# Patient Record
Sex: Male | Born: 1995 | Race: White | Hispanic: No | Marital: Single | Smoking: Never smoker
Health system: Southern US, Community
[De-identification: ages and names within clinical notes are randomized; demographics above are authoritative.]

## PROBLEM LIST (undated history)

## (undated) DIAGNOSIS — K439 Ventral hernia without obstruction or gangrene: Secondary | ICD-10-CM

---

## 2017-02-18 ENCOUNTER — Encounter (HOSPITAL_COMMUNITY): Payer: Self-pay | Admitting: Emergency Medicine

## 2017-02-18 ENCOUNTER — Emergency Department (HOSPITAL_COMMUNITY): Payer: Self-pay

## 2017-02-18 DIAGNOSIS — W51XXXA Accidental striking against or bumped into by another person, initial encounter: Secondary | ICD-10-CM | POA: Insufficient documentation

## 2017-02-18 DIAGNOSIS — Y998 Other external cause status: Secondary | ICD-10-CM | POA: Insufficient documentation

## 2017-02-18 DIAGNOSIS — Z79899 Other long term (current) drug therapy: Secondary | ICD-10-CM | POA: Insufficient documentation

## 2017-02-18 DIAGNOSIS — Y9367 Activity, basketball: Secondary | ICD-10-CM | POA: Insufficient documentation

## 2017-02-18 DIAGNOSIS — S83015A Lateral dislocation of left patella, initial encounter: Secondary | ICD-10-CM | POA: Insufficient documentation

## 2017-02-18 DIAGNOSIS — Y929 Unspecified place or not applicable: Secondary | ICD-10-CM | POA: Insufficient documentation

## 2017-02-18 NOTE — ED Triage Notes (Signed)
[facility] and jumped up for the ball and became tangled with other players  Pt is c/o left knee pain  EMS applied a splint to left leg  Pt was unable to bear weight  Pt states his knee feels numb now but it is painful to move

## 2017-02-19 ENCOUNTER — Emergency Department (HOSPITAL_COMMUNITY): Payer: Self-pay

## 2017-02-19 ENCOUNTER — Emergency Department (HOSPITAL_COMMUNITY)
Admission: EM | Admit: 2017-02-19 | Discharge: 2017-02-19 | Disposition: A | Discharge status: Home / Self Care | Payer: Self-pay | Attending: Emergency Medicine | Admitting: Emergency Medicine

## 2017-02-19 ENCOUNTER — Encounter (HOSPITAL_COMMUNITY): Payer: Self-pay | Admitting: Radiology

## 2017-02-19 DIAGNOSIS — S83005A Unspecified dislocation of left patella, initial encounter: Secondary | ICD-10-CM

## 2017-02-19 HISTORY — DX: Ventral hernia without obstruction or gangrene: K43.9

## 2017-02-19 MED ORDER — OXYCODONE-ACETAMINOPHEN 5-325 MG PO TABS: 1.0000 | ORAL_TABLET | Freq: Four times a day (QID) | ORAL | 0 refills | Status: AC | PRN

## 2017-02-19 MED ORDER — OXYCODONE-ACETAMINOPHEN 5-325 MG PO TABS
1.0000 | ORAL_TABLET | Freq: Once | ORAL | Status: AC
  Administered 2017-02-19: 1 via ORAL
  Filled 2017-02-19: qty 1

## 2017-02-19 MED ORDER — OXYCODONE-ACETAMINOPHEN 5-325 MG PO TABS
1.0000 | ORAL_TABLET | ORAL | Status: DC | PRN
  Administered 2017-02-19: 1 via ORAL
  Filled 2017-02-19: qty 1

## 2017-02-19 MED ORDER — MORPHINE SULFATE (PF) 2 MG/ML IV SOLN
4.0000 mg | Freq: Once | INTRAVENOUS | Status: AC
  Administered 2017-02-19: 4 mg via INTRAVENOUS
  Filled 2017-02-19: qty 2

## 2017-02-19 NOTE — ED Provider Notes (Signed)
[facility]-EMERGENCY DEPT Provider Note   CSN: [id number] Arrival date & time: 02/18/17  2316  By signing my name below, I, Ny'Kea Lewis, attest that this documentation has been prepared under the direction and in the presence of Karri Kallenbach, Mayer Maskerourtney F, MD. Electronically Signed: Karren CobbleNy'Kea Lewis, ED Scribe. 02/19/17. 4:46 AM.   History   Chief Complaint Chief Complaint  Patient presents with  . Knee Pain   The history is provided by the patient. No language interpreter was used.    HPI Comments: Christian Bolton is a 21 y.o. male brought in by ambulance, who presents to the Emergency Department complaining of sudden onset, left knee injury s/p basketball injury PTA. Pt notes some numbness to his knee, but is able to move . He reports he was playing basketball when he jumped for the ball and became tangled with other players, per nurse's note. Denies head injury or loss of consciousness.  No other acute associated symptoms noted. Patient rates pain 8 out of 10. Denies numbness, or tingling.  Past Medical History:  Diagnosis Date  . Abdominal wall hernia    There are no active problems to display for this patient.  History reviewed. No pertinent surgical history.  Home Medications    Prior to Admission medications   Medication Sig Start Date End Date Taking? Authorizing Provider  oxyCODONE-acetaminophen (PERCOCET/ROXICET) 5-325 MG tablet Take 1-2 tablets by mouth every 6 (six) hours as needed for severe pain. 02/19/17   Lindi Abram, Mayer Maskerourtney F, MD   Family History History reviewed. No pertinent family history.  Social History Social History  Substance Use Topics  . Smoking status: Never Smoker  . Smokeless tobacco: Never Used  . Alcohol use Yes     Comment: occ   Allergies   Patient has no known allergies.  Review of Systems Review of Systems  Musculoskeletal:       Left knee pain  Skin: Negative for wound.  Neurological: Positive for numbness. Negative for weakness.       Left knee  numbness.   All other systems reviewed and are negative.   Physical Exam Updated Vital Signs BP 118/84 (BP Location: Left Arm)   Pulse 68   Temp 98 F (36.7 C) (Oral)   Resp 18   Ht 6' (1.829 m)   Wt 85.3 kg (188 lb)   SpO2 100%   BMI 25.50 kg/m   Physical Exam  Constitutional: He is oriented to person, place, and time. He appears well-developed and well-nourished.  HENT:  Head: Normocephalic and atraumatic.  Cardiovascular: Normal rate, regular rhythm and normal heart sounds.   Pulmonary/Chest: Effort normal and breath sounds normal. No respiratory distress. He has no wheezes.  Abdominal: Soft. There is no tenderness.  Musculoskeletal:  Noted to the left knee, decreased range of motion, unable to fire quad, patella displaced laterally, 2+ DP pulse  Neurological: He is alert and oriented to person, place, and time.  Skin: Skin is warm and dry.  Psychiatric: He has a normal mood and affect.  Nursing note and vitals reviewed.   ED Treatments / Results  DIAGNOSTIC STUDIES: Oxygen Saturation is 100% on RA, normal by my interpretation.   COORDINATION OF CARE: 2:09 AM-Discussed next steps with pt. Pt verbalized understanding and is agreeable with the plan. ' Labs (all labs ordered are listed, but only abnormal results are displayed) Labs Reviewed - No data to display  EKG  EKG Interpretation None       Radiology Ct Knee Left Wo Contrast  Result Date: 02/19/2017 CLINICAL DATA:  Patellar dislocation after basketball injury. Knee pain. EXAM: CT OF THE LEFT KNEE WITHOUT CONTRAST TECHNIQUE: Multidetector CT imaging of the LEFT knee was performed according to the standard protocol. Multiplanar CT image reconstructions were also generated. COMPARISON:  Radiographs of the left knee. FINDINGS: Bones/Joint/Cartilage Laterally subluxed patella from the trochlear groove with moderate lipohemarthrosis. A few punctate ossific densities are seen adjacent to the lateral femoral  condyles with donor site suspected off the anterolateral aspect of the lateral femoral condyle. The femorotibial compartment is maintained. Next Ligaments Suboptimally assessed by CT. Muscles and Tendons No intramuscular hemorrhage.  Intact extensor mechanism tendons. Soft tissues No soft tissue hematoma. IMPRESSION: 1. The patella is no longer dislocated but still laterally subluxed in appearance relative to the trochlear groove. 2. Stigmata of prior patellar dislocation with punctate ossific densities along the anterolateral aspect of the lateral femoral condyles with changes of mild impaction along the anterolateral femoral cortex. 3. Moderate suprapatellar lipohemarthrosis. Electronically Signed   By: Tollie Eth M.D.   On: 02/19/2017 03:52   Dg Knee Complete 4 Views Left  Result Date: 02/19/2017 CLINICAL DATA:  Left knee pain and swelling. Deformity. Injury playing basketball falling on concrete with twisting injury. EXAM: LEFT KNEE - COMPLETE 4+ VIEW COMPARISON:  None. FINDINGS: Lateral patellar dislocation. Patella appears perched on the lateral femoral condylar with a condylar impaction fracture. Joint effusion is present. Proximal tibia and fibular grossly intact. IMPRESSION: Lateral patellar dislocation with impaction fracture of the lateral femoral condyle. Electronically Signed   By: Rubye Oaks M.D.   On: 02/19/2017 00:07    Procedures Reduction of dislocation Date/Time: 02/19/2017 5:01 AM Performed by: Shon Baton Authorized by: Shon Baton  Consent: Verbal consent obtained. Written consent not obtained. Risks and benefits: risks, benefits and alternatives were discussed Consent given by: patient Local anesthesia used: no  Anesthesia: Local anesthesia used: no  Sedation: Patient sedated: no Patient tolerance: Patient tolerated the procedure well with no immediate complications Comments: Patient was given analgesic. Patella was immediately relocated with extension  of the leg. However, continued to sublux laterally.    (including critical care time)  Medications Ordered in ED Medications  oxyCODONE-acetaminophen (PERCOCET/ROXICET) 5-325 MG per tablet 1 tablet (1 tablet Oral Given 02/19/17 0109)  morphine 2 MG/ML injection 4 mg (4 mg Intravenous Given 02/19/17 0229)  oxyCODONE-acetaminophen (PERCOCET/ROXICET) 5-325 MG per tablet 1 tablet (1 tablet Oral Given 02/19/17 0439)     Initial Impression / Assessment and Plan / ED Course  I have reviewed the triage vital signs and the nursing notes.  Pertinent labs & imaging results that were available during my care of the patient were reviewed by me and considered in my medical decision making (see chart for details).     Patient presents with left knee injury. X-ray show patellar dislocation. Clinically he is dislocated laterally with a large effusion. After multiple attempts of relocation, there was some improved anatomic alignment but persistent lateral displacement. CT scan was obtained. Patella is no longer dislocated but continues to be subluxed with a lipoma hemarthrosis. Discussed this with Dr. Ave Filter. Patient placed in a knee immobilizer. Weight-bear as tolerated with the knee immobilizer. Follow-up with Dr. Ave Filter in the office.  After history, exam, and medical workup I feel the patient has been appropriately medically screened and is safe for discharge home. Pertinent diagnoses were discussed with the patient. Patient was given return precautions.   Final Clinical Impressions(s) / ED Diagnoses  Final diagnoses:  Patellar dislocation, left, initial encounter    New Prescriptions New Prescriptions   OXYCODONE-ACETAMINOPHEN (PERCOCET/ROXICET) 5-325 MG TABLET    Take 1-2 tablets by mouth every 6 (six) hours as needed for severe pain.   I personally performed the services described in this documentation, which was scribed in my presence. The recorded information has been reviewed and is  accurate.     Shon Baton, MD 02/19/17 [phone]

## 2017-02-19 NOTE — Discharge Instructions (Signed)
You were seen today for injury to the left knee. You had a patellar dislocation and subluxation. Maintain knee immobilizer. You can weight-bear as tolerated. Ice and elevate. Follow-up with the orthopedist in clinic on Wednesday.

## 2018-07-23 IMAGING — CR DG KNEE COMPLETE 4+V*L*
4 series · 4 of 4 positions shown · non-contrast
Comparison: None.

CLINICAL DATA: Left knee pain and swelling. Deformity. Injury
playing basketball falling on concrete with twisting injury.

EXAM:
LEFT KNEE - COMPLETE 4+ VIEW

[x knee ap left]
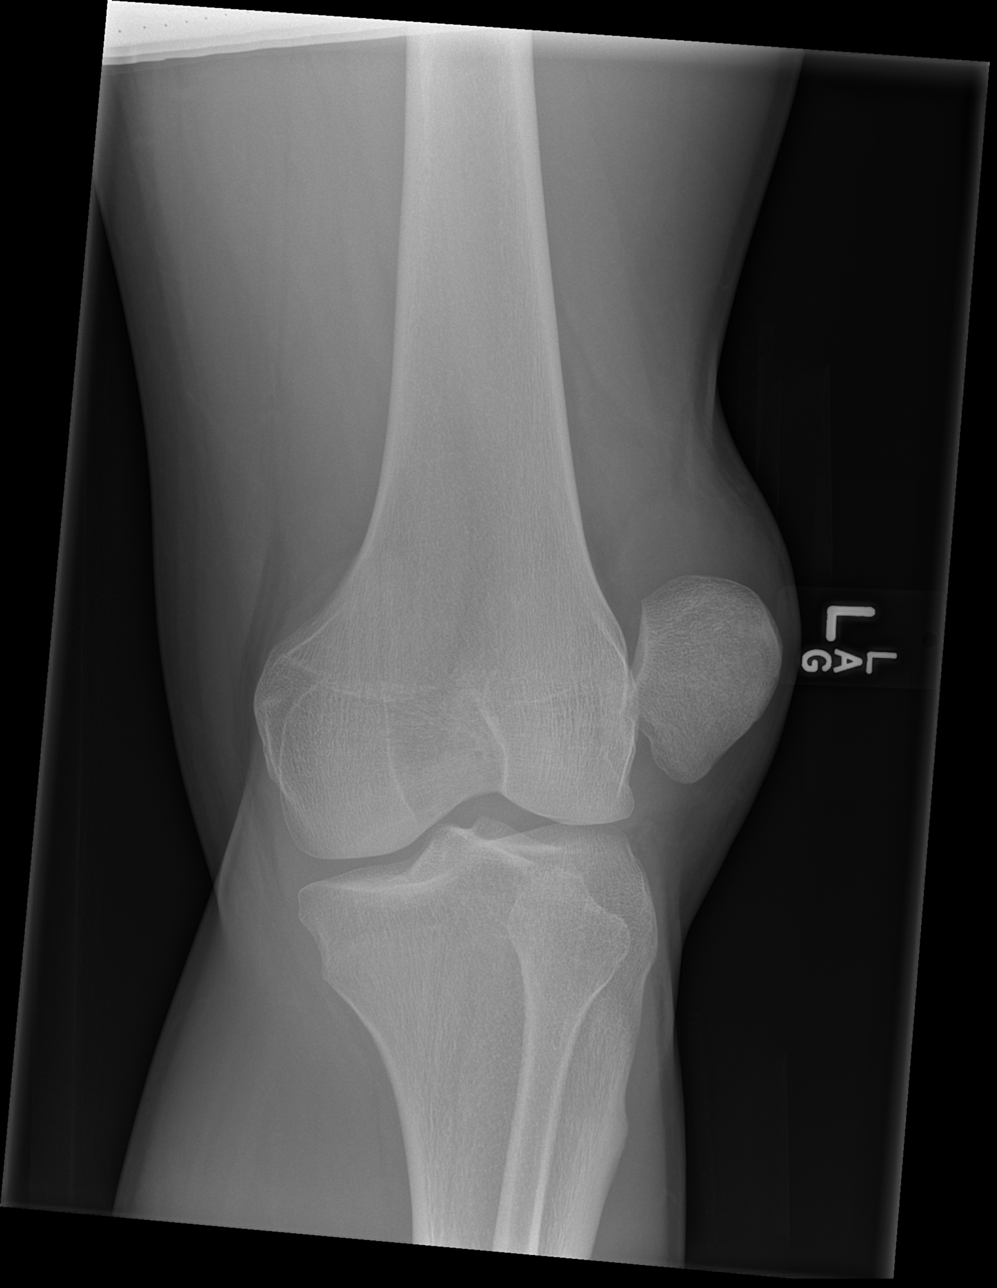

[x knee obl left (1 of 2)]
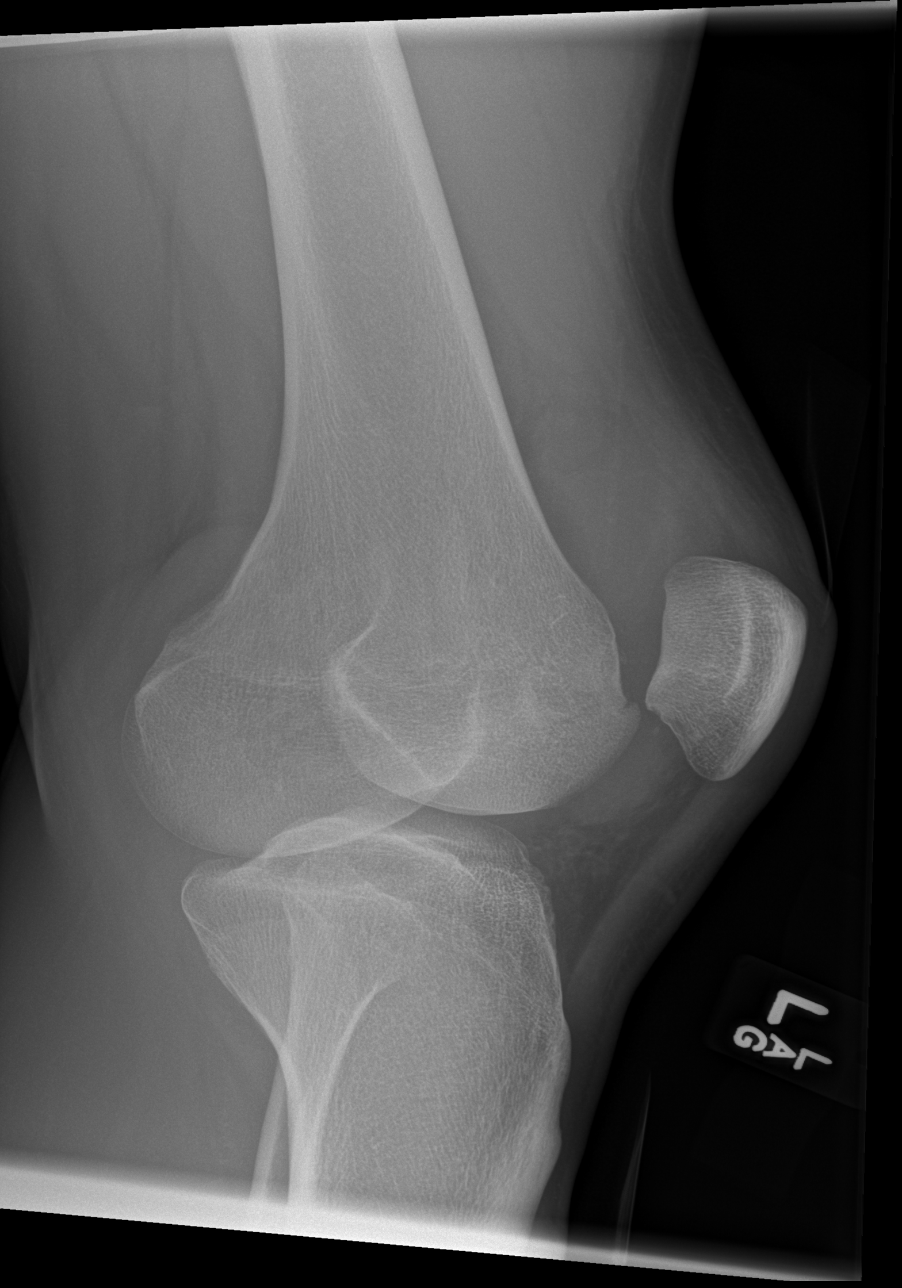

[x knee obl left (2 of 2)]
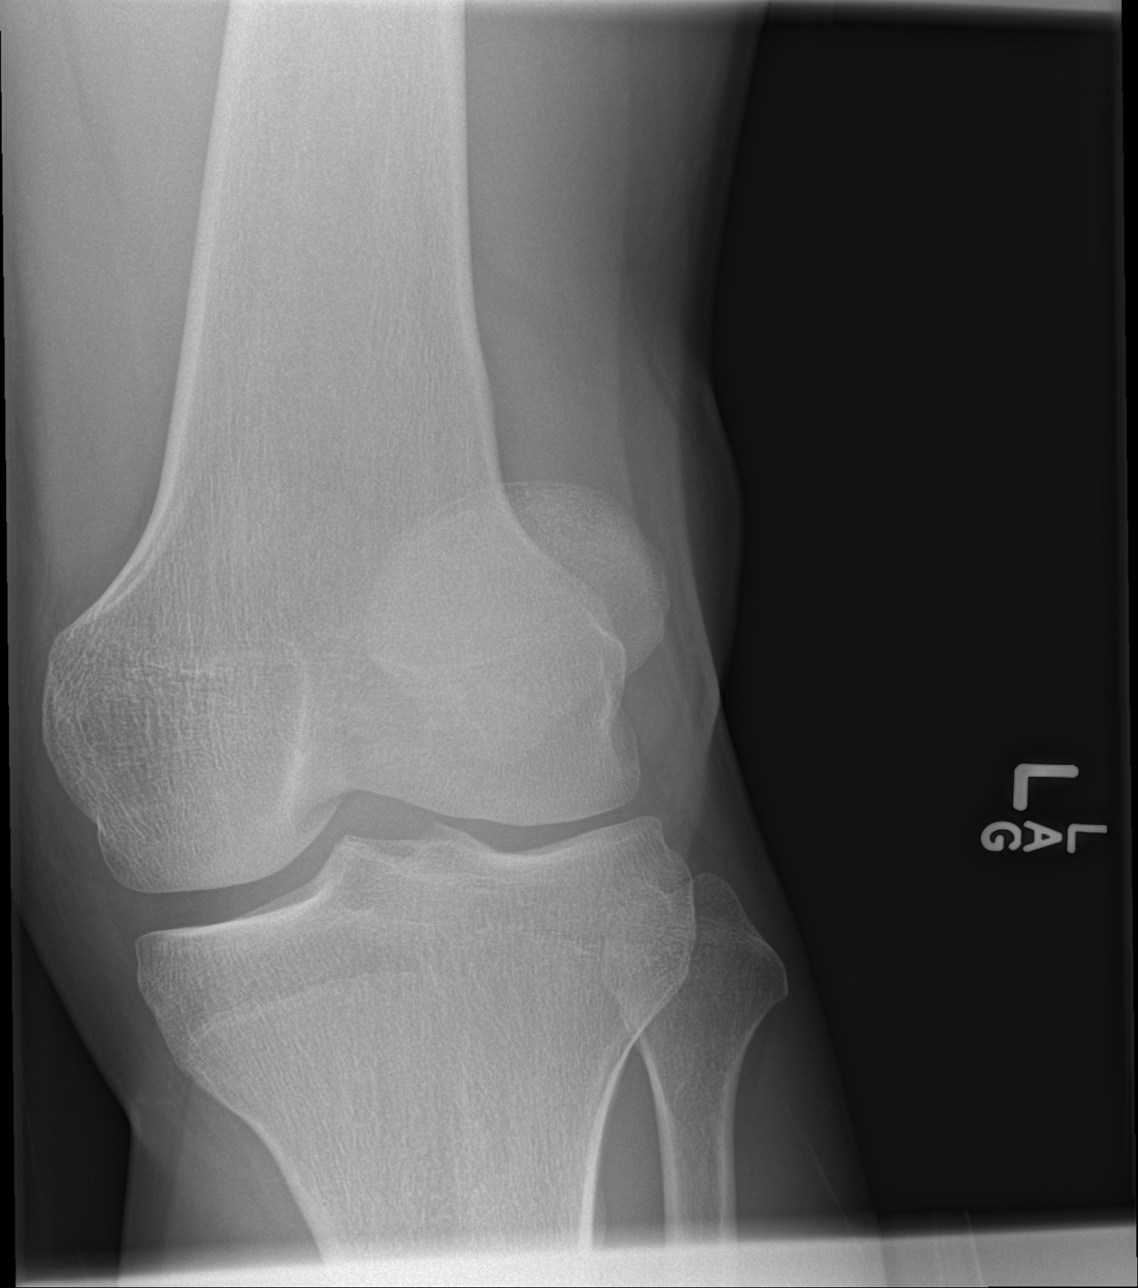

[x knee lat left]
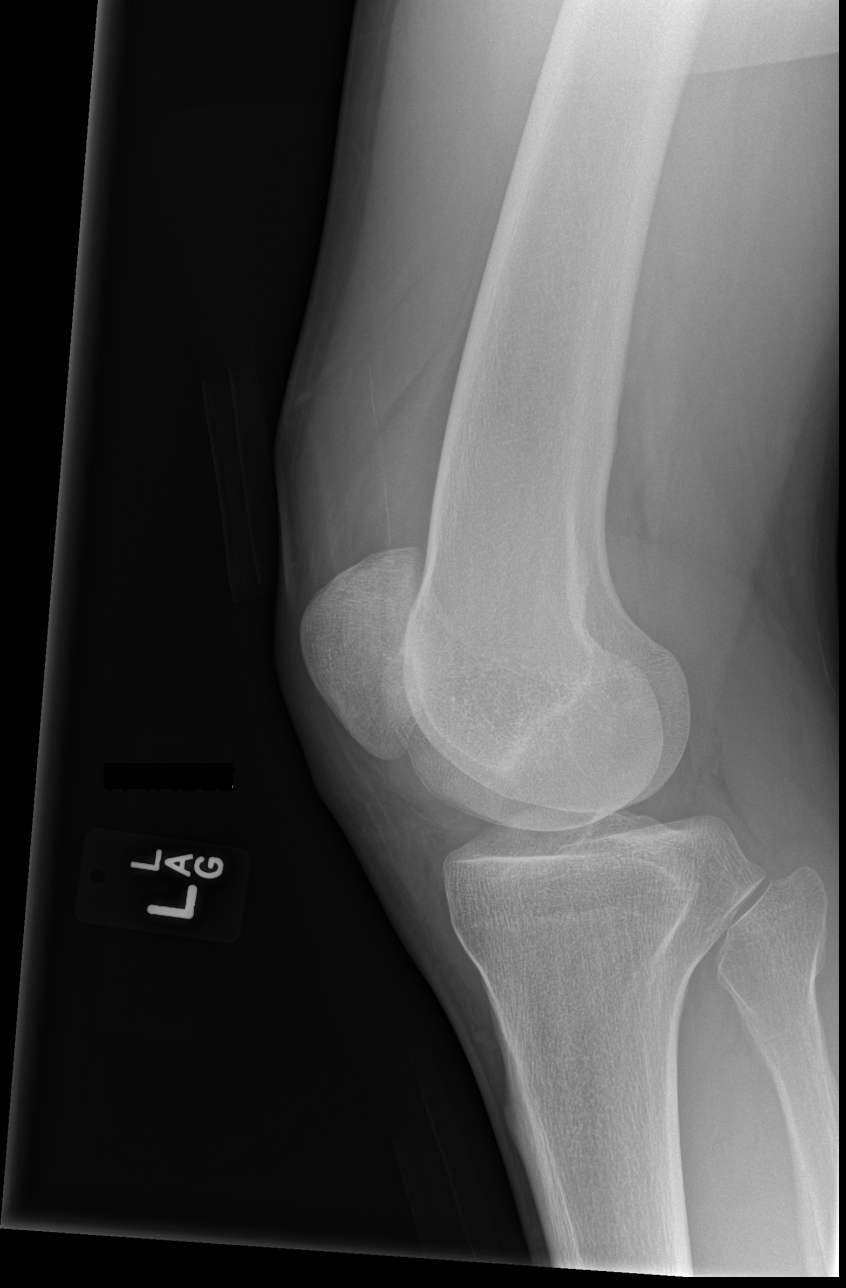

[4 of 4 positions shown; findings below may reference images not displayed]

FINDINGS: Lateral patellar dislocation. Patella appears perched on the lateral
femoral condylar with a condylar impaction fracture. Joint effusion
is present. Proximal tibia and fibular grossly intact.
IMPRESSION: Lateral patellar dislocation with impaction fracture of the lateral
femoral condyle.
